# Patient Record
Sex: Female | Born: 1970 | Race: White | Hispanic: No | Marital: Married
Health system: Southern US, Community
[De-identification: ages and names within clinical notes are randomized; demographics above are authoritative.]

---

## 2018-12-19 ENCOUNTER — Other Ambulatory Visit: Payer: Self-pay

## 2018-12-19 ENCOUNTER — Emergency Department
Admission: EM | Admit: 2018-12-19 | Discharge: 2018-12-19 | Disposition: A | Discharge status: Home / Self Care | Payer: No Typology Code available for payment source | Attending: Student in an Organized Health Care Education/Training Program | Admitting: Student in an Organized Health Care Education/Training Program

## 2018-12-19 ENCOUNTER — Emergency Department: Payer: No Typology Code available for payment source

## 2018-12-19 DIAGNOSIS — Y929 Unspecified place or not applicable: Secondary | ICD-10-CM | POA: Diagnosis not present

## 2018-12-19 DIAGNOSIS — W231XXA Caught, crushed, jammed, or pinched between stationary objects, initial encounter: Secondary | ICD-10-CM | POA: Insufficient documentation

## 2018-12-19 DIAGNOSIS — Y999 Unspecified external cause status: Secondary | ICD-10-CM | POA: Diagnosis not present

## 2018-12-19 DIAGNOSIS — Z23 Encounter for immunization: Secondary | ICD-10-CM | POA: Insufficient documentation

## 2018-12-19 DIAGNOSIS — S91311A Laceration without foreign body, right foot, initial encounter: Secondary | ICD-10-CM | POA: Insufficient documentation

## 2018-12-19 DIAGNOSIS — Y939 Activity, unspecified: Secondary | ICD-10-CM | POA: Diagnosis not present

## 2018-12-19 DIAGNOSIS — S99921A Unspecified injury of right foot, initial encounter: Secondary | ICD-10-CM | POA: Diagnosis present

## 2018-12-19 MED ORDER — TETANUS-DIPHTH-ACELL PERTUSSIS 5-2.5-18.5 LF-MCG/0.5 IM SUSP
0.5000 mL | Freq: Once | INTRAMUSCULAR | Status: AC
  Administered 2018-12-19: 0.5 mL via INTRAMUSCULAR
  Filled 2018-12-19: qty 0.5

## 2018-12-19 MED ORDER — DOXYCYCLINE HYCLATE 50 MG PO CAPS: 100.0000 mg | ORAL_CAPSULE | Freq: Two times a day (BID) | ORAL | 0 refills | Status: AC

## 2018-12-19 MED ORDER — LIDOCAINE HCL (PF) 1 % IJ SOLN
5.0000 mL | Freq: Once | INTRAMUSCULAR | Status: AC
  Administered 2018-12-19: 5 mL via INTRADERMAL
  Filled 2018-12-19: qty 5

## 2018-12-19 NOTE — ED Triage Notes (Signed)
Pt states that she dropped a tape dispenser on top of her rt foot, pt has laceration vs avulsion to the top of her foot

## 2018-12-19 NOTE — Discharge Instructions (Addendum)
Keep sutures dry for 24 hours.  Keep laceration covered.  Please follow-up with primary care, podiatry, urgent care in [place].

## 2018-12-19 NOTE — ED Notes (Signed)

## 2018-12-19 NOTE — ED Provider Notes (Signed)
[facility] Emergency Department Provider Note  ____________________________________________  Time seen: Approximately 1:18 PM  I have reviewed the triage vital signs and the nursing notes.   HISTORY  Chief Complaint Foot Pain    HPI Victoria Rivera is a 48 y.o. female that presents emergency department for evaluation of left foot injury.  Patient dropped a tape dispenser on her foot this morning.  Tetanus shot is out of date.   No past medical history on file.  There are no active problems to display for this patient.   Prior to Admission medications   Medication Sig Start Date End Date Taking? Authorizing Provider  doxycycline (VIBRAMYCIN) 50 MG capsule Take 2 capsules (100 mg total) by mouth 2 (two) times daily for 10 days. 12/19/18 12/29/18  Laban Emperor, PA-C    Allergies Ampicillin  No family history on file.  Social History Social History   Tobacco Use  . Smoking status: Not on file  Substance Use Topics  . Alcohol use: Not on file  . Drug use: Not on file     Review of Systems  Gastrointestinal:  No nausea, no vomiting.  Musculoskeletal: Positive for foot pain. Skin: Negative for rash, abrasions, ecchymosis.  Positive for laceration.  ____________________________________________   PHYSICAL EXAM:  VITAL SIGNS: ED Triage Vitals  Enc Vitals Group     BP 12/19/18 1139 134/87     Pulse Rate 12/19/18 1139 93     Resp 12/19/18 1139 18     Temp 12/19/18 1139 98 F (36.7 C)     Temp Source 12/19/18 1139 Oral     SpO2 12/19/18 1139 98 %     Weight 12/19/18 1140 165 lb (74.8 kg)     Height 12/19/18 1140 5\' 7"  (1.702 m)     Head Circumference --      Peak Flow --      Pain Score 12/19/18 1140 5     Pain Loc --      Pain Edu? --      Excl. in [place]? --      Constitutional: Alert and oriented. Well appearing and in no acute distress. Eyes: Conjunctivae are normal. PERRL. EOMI. Head: Atraumatic. ENT:      Ears:      Nose: No  congestion/rhinnorhea.      Mouth/Throat: Mucous membranes are moist.  Neck: No stridor.  Cardiovascular: Normal rate, regular rhythm.  Good peripheral circulation. Respiratory: Normal respiratory effort without tachypnea or retractions. Lungs CTAB. Good air entry to the bases with no decreased or absent breath sounds. Musculoskeletal: Full range of motion to all extremities. No gross deformities appreciated. Neurologic:  Normal speech and language. No gross focal neurologic deficits are appreciated.  Skin:  Skin is warm, dry. 1cm laceration to left foot.  Psychiatric: Mood and affect are normal. Speech and behavior are normal. Patient exhibits appropriate insight and judgement.   ____________________________________________   LABS (all labs ordered are listed, but only abnormal results are displayed)  Labs Reviewed - No data to display ____________________________________________  EKG   ____________________________________________  RADIOLOGY Robinette Haines, personally viewed and evaluated these images (plain radiographs) as part of my medical decision making, as well as reviewing the written report by the radiologist.  Dg Foot Complete Right  Result Date: 12/19/2018 CLINICAL DATA:  48 year old female with a history of foot pain EXAM: RIGHT FOOT COMPLETE - 3+ VIEW COMPARISON:  None. FINDINGS: There is no evidence of fracture or dislocation. There is no evidence  of arthropathy or other focal bone abnormality. Soft tissues are unremarkable. IMPRESSION: Negative for acute bony abnormality Electronically Signed   By: Gilmer MorJaime  Oluwaseun Cremer D.O.   On: 12/19/2018 13:16    ____________________________________________    PROCEDURES  Procedure(s) performed:    Procedures  LACERATION REPAIR Performed by: Enid DerryAshley Vincy Feliz  Consent: Verbal consent obtained.  Consent given by: patient  Prepped and Draped in normal sterile fashion  Wound explored: No foreign bodies   Laceration  Location: foot  Laceration Length: 1 cm  Anesthesia: None  Local anesthetic: lidocaine 1% without epinephrine  Anesthetic total: 3 ml  Irrigation method: syringe  Amount of cleaning: 500ml normal saline  Skin closure: 4-0 nylon and 6-0 rapid vicryl  Number of sutures: 4 superficial and 2 deep  Technique: Simple interrupted  Patient tolerance: Patient tolerated the procedure well with no immediate complications.  Medications  Tdap (BOOSTRIX) injection 0.5 mL (0.5 mLs Intramuscular Given 12/19/18 1328)  lidocaine (PF) (XYLOCAINE) 1 % injection 5 mL (5 mLs Intradermal Given by Other 12/19/18 1328)     ____________________________________________   INITIAL IMPRESSION / ASSESSMENT AND PLAN / ED COURSE  Pertinent labs & imaging results that were available during my care of the patient were reviewed by me and considered in my medical decision making (see chart for details).  Review of the [place] CSRS was performed in accordance of the [facility] prior to dispensing any controlled drugs.   Patient's diagnosis is consistent with foot laceration.  Vital signs and exam are reassuring.  Laceration was repaired with stitches.  Patient will be discharged home with prescriptions for Keflex. Patient is to follow up with podiatry as directed.  Patient is moving to [place] in 2 days and will follow-up there.  Patient is given ED precautions to return to the ED for any worsening or new symptoms.  [name] was evaluated in Emergency Department on 12/19/2018 for the symptoms described in the history of present illness. She was evaluated in the context of the global COVID-19 pandemic, which necessitated consideration that the patient might be at risk for infection with the SARS-CoV-2 virus that causes COVID-19. Institutional protocols and algorithms that pertain to the evaluation of patients at risk for COVID-19 are in a state of rapid change based on information released by regulatory bodies including the  CDC and federal and state organizations. These policies and algorithms were followed during the patient's care in the ED.   ____________________________________________  FINAL CLINICAL IMPRESSION(S) / ED DIAGNOSES  Final diagnoses:  Laceration of right foot, initial encounter      NEW MEDICATIONS STARTED DURING THIS VISIT:  ED Discharge Orders         Ordered    doxycycline (VIBRAMYCIN) 50 MG capsule  2 times daily     12/19/18 1355              This chart was dictated using voice recognition software/Dragon. Despite best efforts to proofread, errors can occur which can change the meaning. Any change was purely unintentional.    Enid DerryWagner, Hubert Derstine, PA-C 12/19/18 1524    Willy Eddyobinson, Patrick, MD 12/19/18 [phone]

## 2020-04-05 IMAGING — DX RIGHT FOOT COMPLETE - 3+ VIEW
3 series · 3 of 3 positions shown · non-contrast
Comparison: None.

CLINICAL DATA: 48-year-old female with a history of foot pain

EXAM:
RIGHT FOOT COMPLETE - 3+ VIEW

[foot ap]
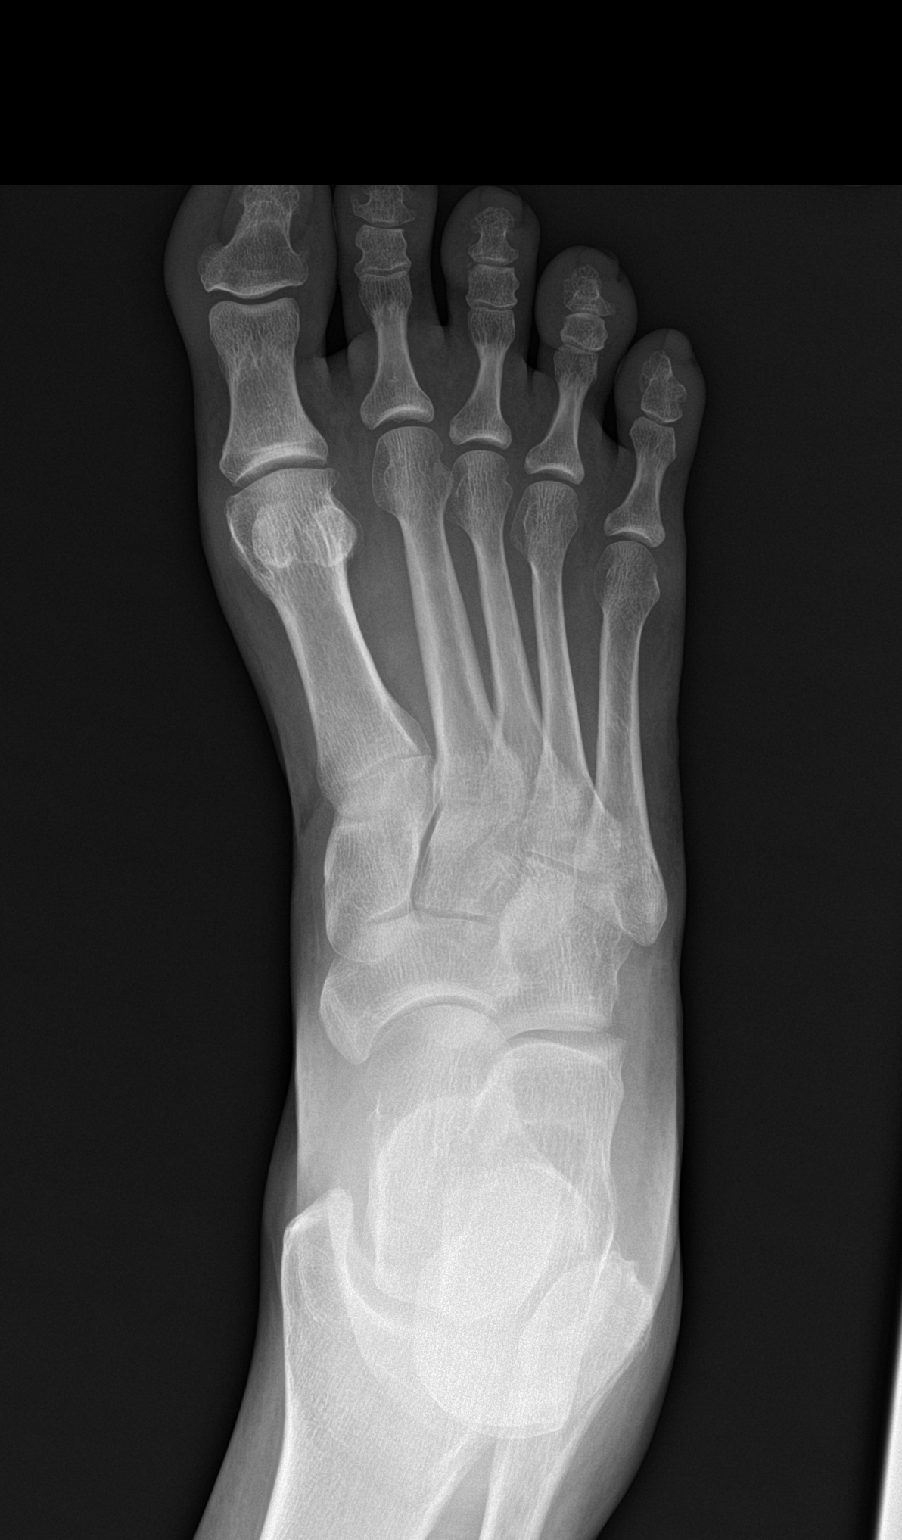

[foot obl]
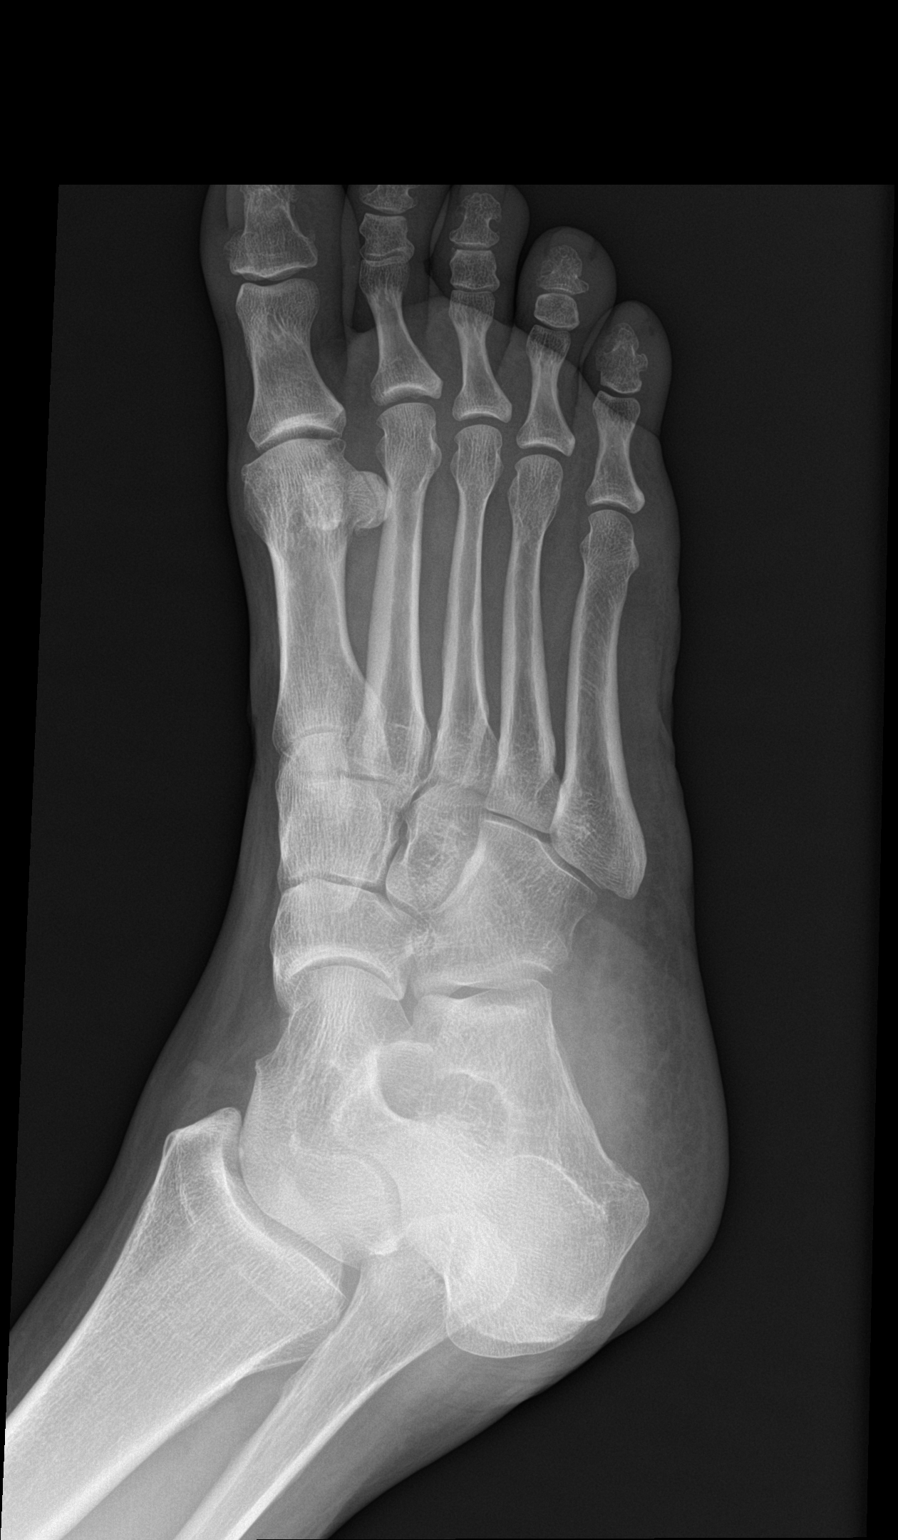

[foot lat]
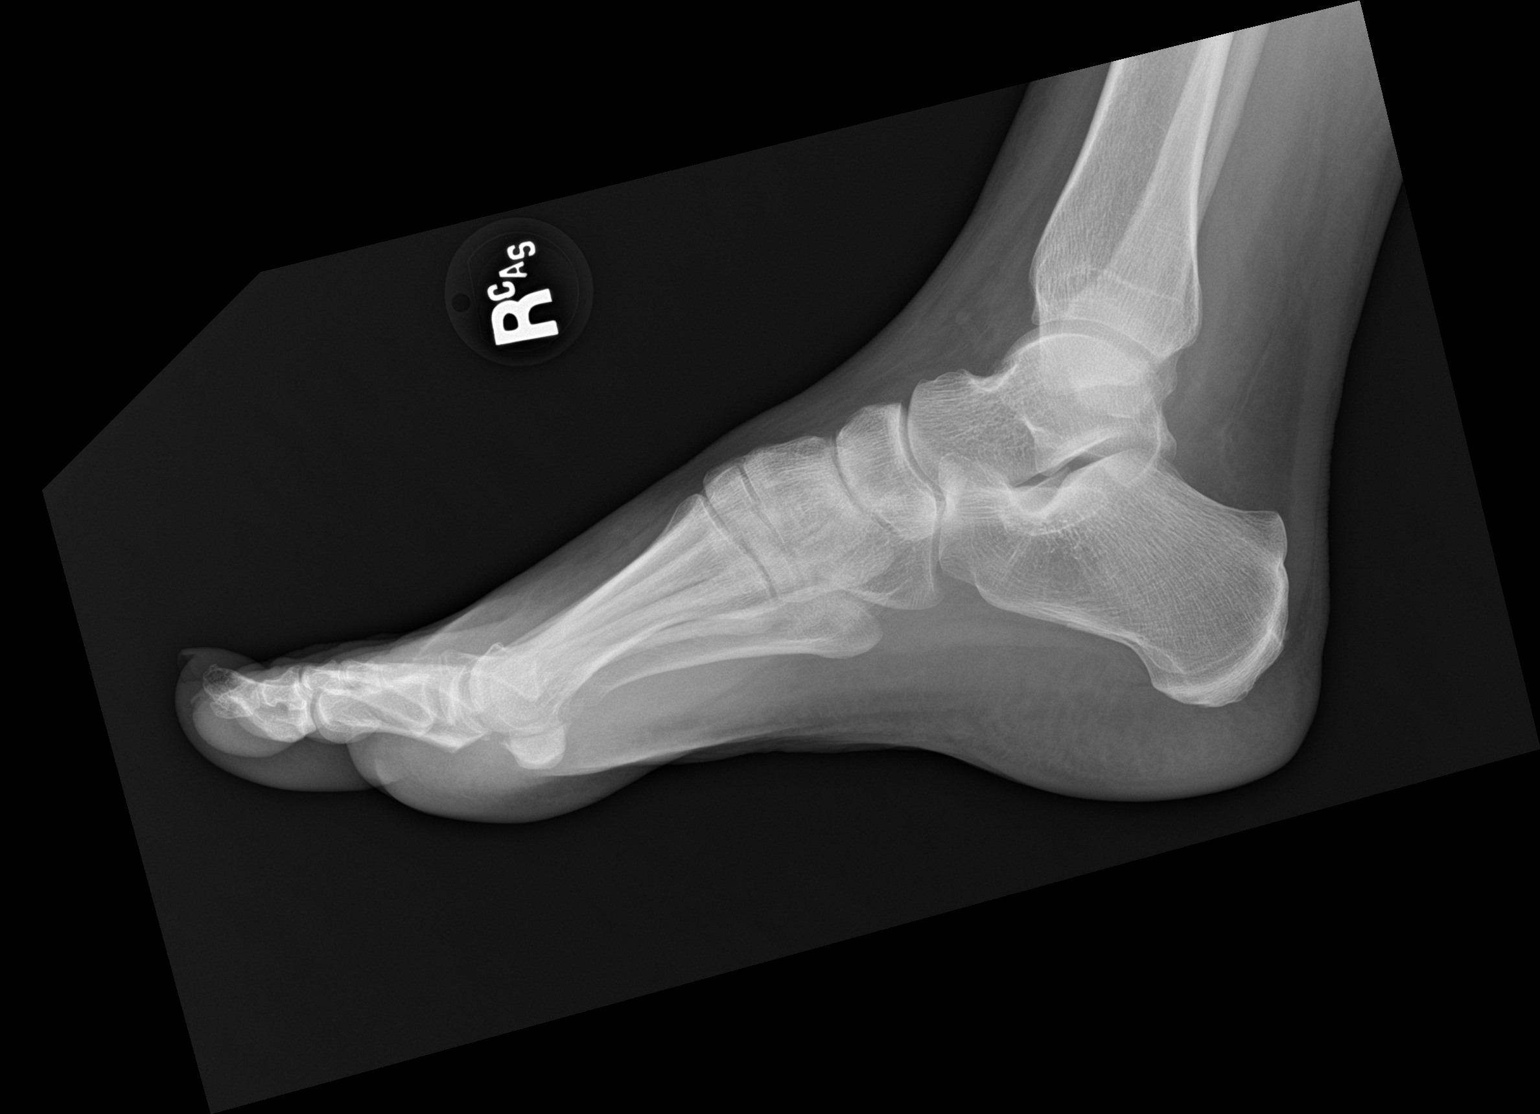

[3 of 3 positions shown; findings below may reference images not displayed]

FINDINGS: There is no evidence of fracture or dislocation. There is no
evidence of arthropathy or other focal bone abnormality. Soft
tissues are unremarkable.
IMPRESSION: Negative for acute bony abnormality
